# Patient Record
Sex: Female | Born: 1960 | Race: Black or African American | Hispanic: No
Health system: Southern US, Community
[De-identification: ages and names within clinical notes are randomized; demographics above are authoritative.]

---

## 2006-01-28 ENCOUNTER — Ambulatory Visit: Payer: Self-pay | Admitting: Internal Medicine

## 2006-02-05 ENCOUNTER — Ambulatory Visit: Payer: Self-pay | Admitting: Internal Medicine

## 2007-06-09 IMAGING — CT CT CHEST W/ CM
1 of 2 series · 14 of 32 positions shown, 18 images · IV contrast (agent unspecified)
Comparison: none

REASON FOR EXAM: Shortness of breath
COMMENTS:

PROCEDURE:     CT  - CT CHEST WITH CONTRAST  - February 05, 2006 [DATE]
RESULT:     CT scan of chest is performed with contrast.
Comparison is made to an examination dated 01/01/2006 performed at [REDACTED] and interpreted by [REDACTED].

[Series 2: soft tissue · axial · 0.59mm/px · z∈[-313,-53]mm · 14 of 58 slices shown, 18 images]
[im 4/58  soft-tissue]
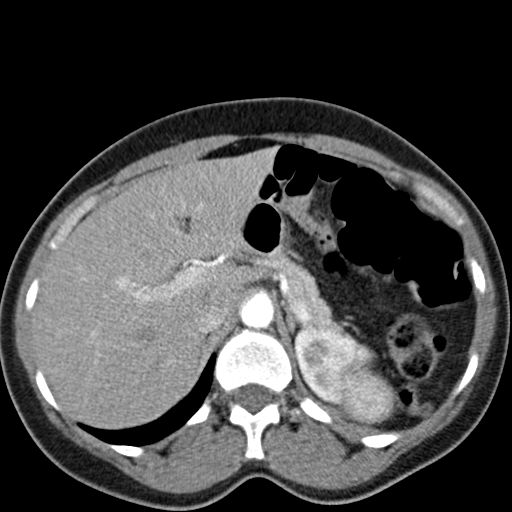
[im 4/58  bone]
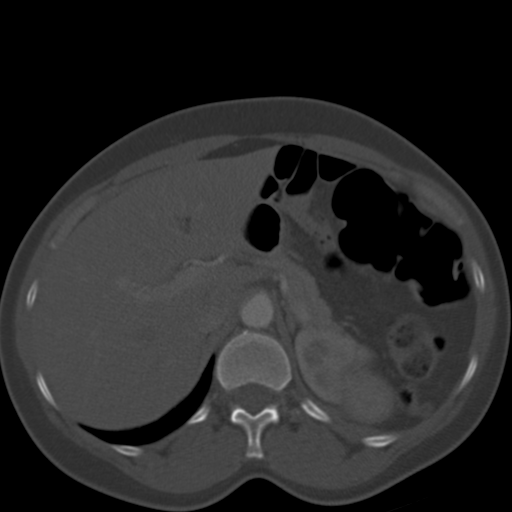
[im 8/58  soft-tissue]
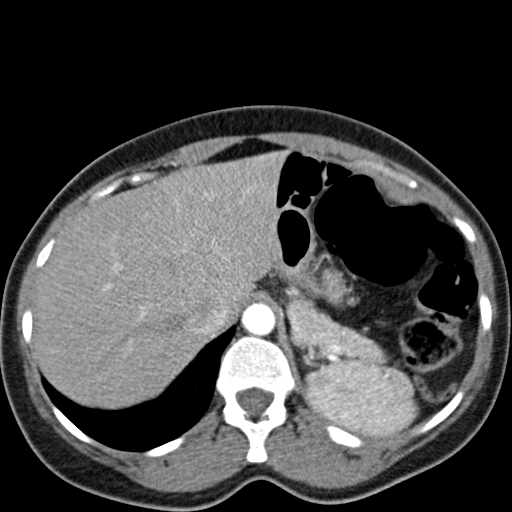
[im 12/58  soft-tissue]
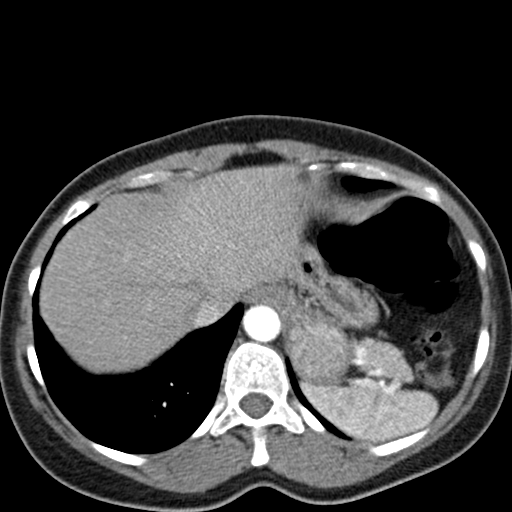
[im 18/58  soft-tissue]
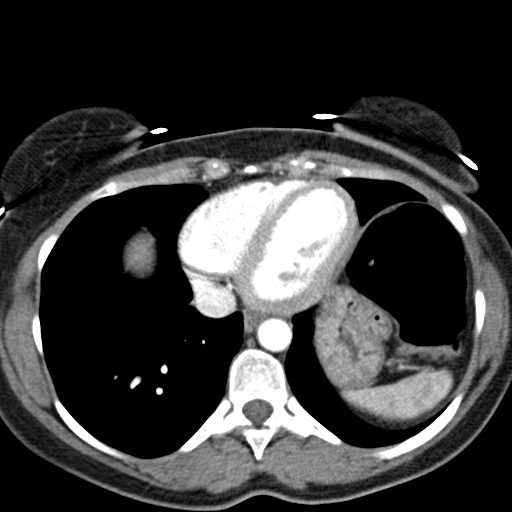
[im 22/58  soft-tissue]
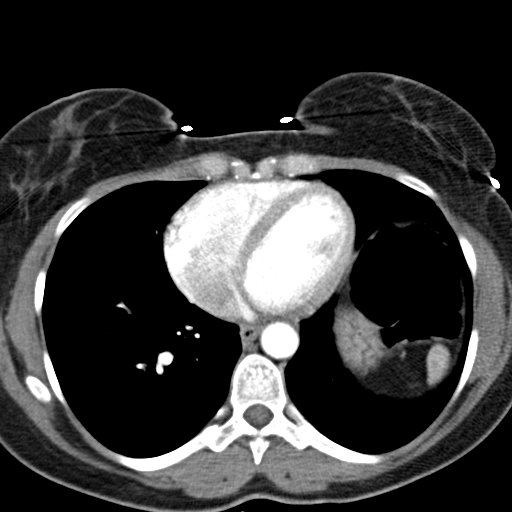
[im 26/58  soft-tissue]
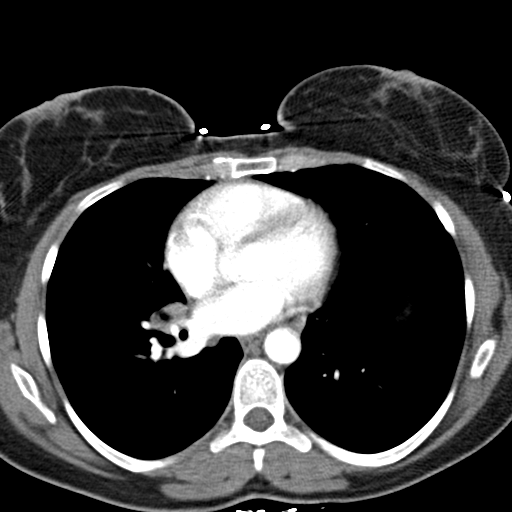
[im 32/58  soft-tissue]
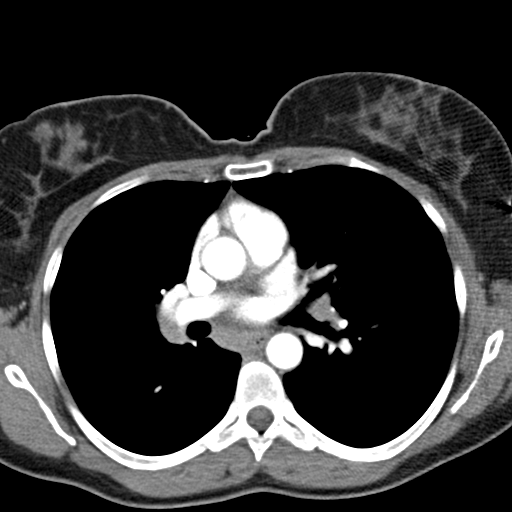
[im 36/58  soft-tissue]
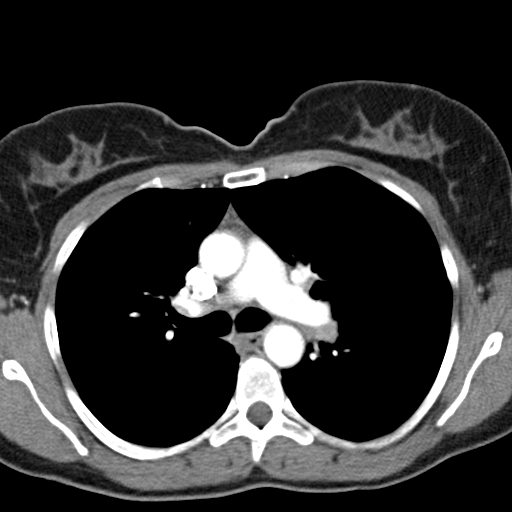
[im 40/58  soft-tissue]
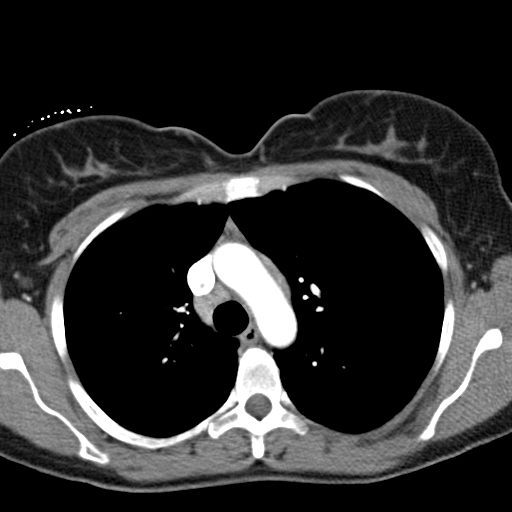
[im 40/58  bone]
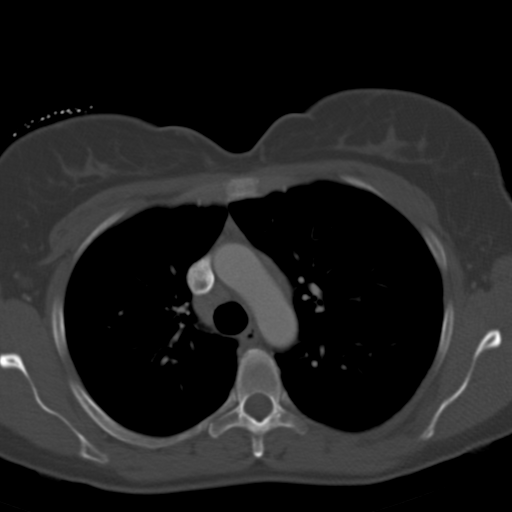
[im 46/58  soft-tissue]
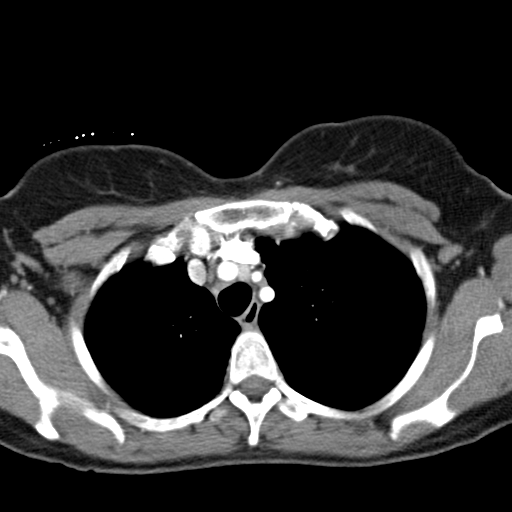
[im 50/58  soft-tissue]
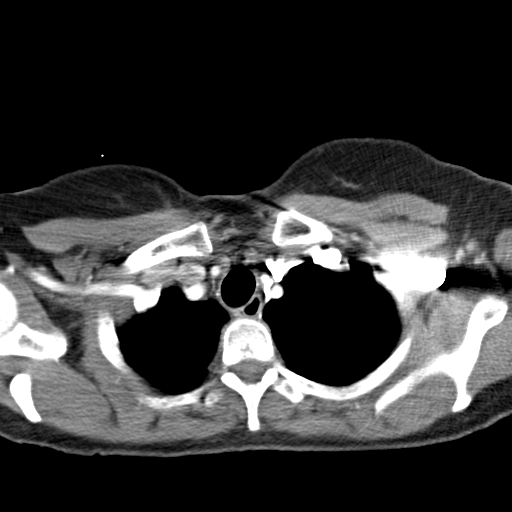
[im 50/58  lung]
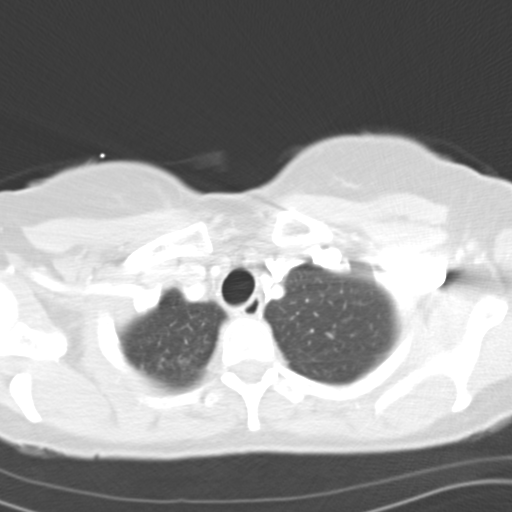
[im 52/58  lung]
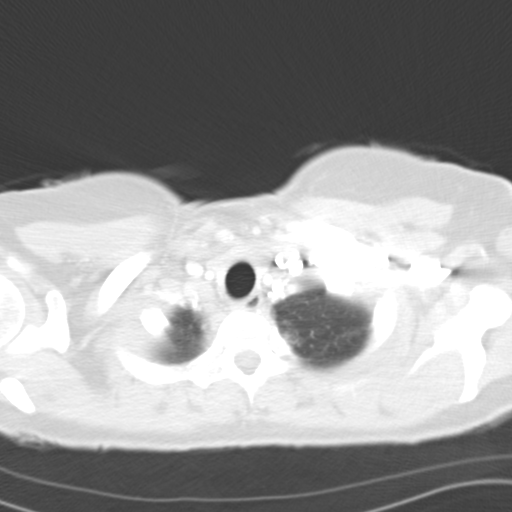
[im 54/58  soft-tissue]
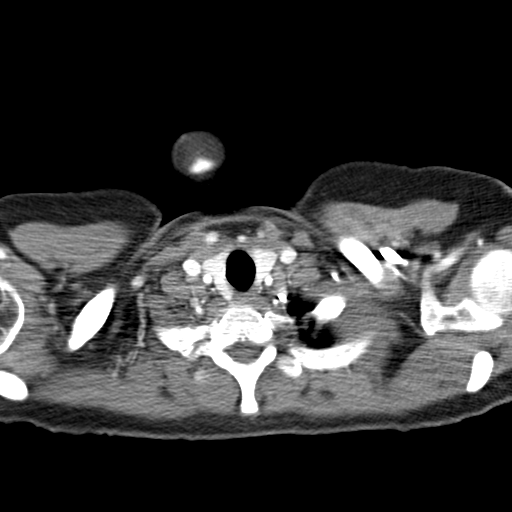
[im 54/58  lung]
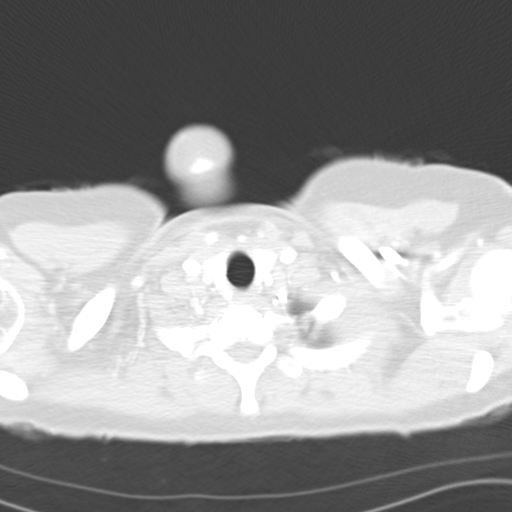
[im 56/58  lung]
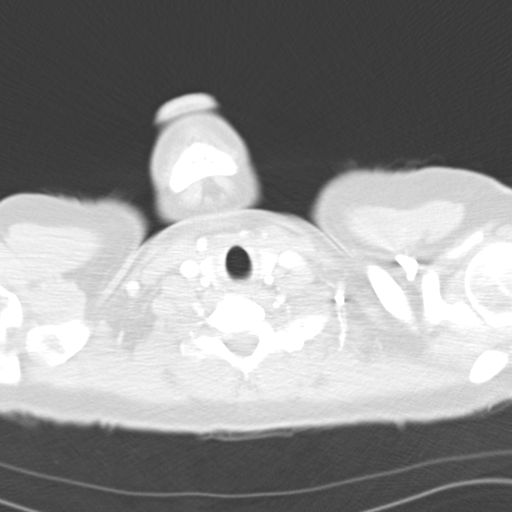

[14 of 32 positions shown; findings below may reference images not displayed]

FINDINGS: There is again minimal nodularity on the lung window images on
image #10 of today's exam which appears to be unchanged. This is not
demonstrated on the soft tissue window settings. No definite associated
calcification is seen. On image #11, there is a tiny density lateral to this
posteriorly near the area of the fissure. The subtle nodular density seen in
the mid portion of the LEFT lung, at the level of the aortic arch on the
previous study (image #17), is seen on the [HOSPITAL] study today on image #18.
These areas of nodularity appear to be stable. No definite cluster of
nodules is seen. The minimal density seen near the minor fissure on the
prior study on image #24 is not evident on today's examination. The lungs
otherwise remain clear. There is mild elevation of the LEFT hemidiaphragm
with a moderate amount of gas seen in the colon as was present on the prior
study. High resolution images show no definite bronchiectasis. No subpleural
sclerosis or linear density is seen elsewhere. There may be minimal apical
fibrosis on the LEFT posteriorly on image #4 of the high resolution images.

Supraclavicular lymph nodes are again noted but not enlarged by size
criteria. Shotty lymph nodes are also noted again in the axillary regions.
There is hilar adenopathy with subcarinal and pretracheal adenopathy as
noted on the prior study. The pretracheal adenopathy measures approximately
13.0 mm on the current study which shows evidence of some increase in size.
The RIGHT hilar lymph node on image #25 measures up to 2.0 cm anterior to
posterior. This appears to be slightly more prominent than on the prior
study.
IMPRESSION: 1.     Slight more pronounced mediastinal adenopathy with persistent
suprahilar and axillary prominent lymph nodes. Subcarinal adenopathy and
pretracheal adenopathy persists.
2.     The nodular densities seen in the lungs as described appear to be
essentially stable although a nodule near the minor fissure on the RIGHT is
not evident. Again, the possibility of sarcoidosis should be considered.

## 2019-12-08 ENCOUNTER — Ambulatory Visit: Payer: Self-pay | Attending: Internal Medicine

## 2019-12-08 DIAGNOSIS — Z23 Encounter for immunization: Secondary | ICD-10-CM

## 2019-12-08 NOTE — Progress Notes (Signed)
   Covid-[facility] Vaccination [facility]  Name:  Hazley Dezeeuw    MRN: [id number] DOB: 1960/12/26  12/08/2019  Ms. Zwicker was observed post Covid-19 immunization for 15 minutes without incident. She was provided with Vaccine Information Sheet and instruction to access the V-Safe system.   Ms. Sherburne was instructed to call 911 with any severe reactions post vaccine: Marland Kitchen Difficulty breathing  . Swelling of face and throat  . A fast heartbeat  . A bad rash all over body  . Dizziness and weakness   Immunizations Administered    Name Date Dose VIS Date Route   Pfizer COVID-19 Vaccine 12/08/2019  5:57 PM 0.3 mL 09/01/2019 Intramuscular   Manufacturer: [employer], [employer]   Lot: [id number]   NDC: [address]-7

## 2019-12-29 ENCOUNTER — Ambulatory Visit: Payer: Self-pay | Attending: Internal Medicine

## 2019-12-29 DIAGNOSIS — Z23 Encounter for immunization: Secondary | ICD-10-CM

## 2019-12-29 NOTE — Progress Notes (Signed)
   Covid-19 Vaccination Clinic  Name:  Khiya Friese    MRN: [id number] DOB: April 14, 1961  12/29/2019  Ms. Klar was observed post Covid-19 immunization for 15 minutes without incident. She was provided with Vaccine Information Sheet and instruction to access the V-Safe system.   Ms. Rawl was instructed to call 911 with any severe reactions post vaccine: Marland Kitchen Difficulty breathing  . Swelling of face and throat  . A fast heartbeat  . A bad rash all over body  . Dizziness and weakness   Immunizations Administered    Name Date Dose VIS Date Route   Pfizer COVID-19 Vaccine 12/29/2019  5:03 PM 0.3 mL 09/01/2019 Intramuscular   Manufacturer: [employer], [employer]   Lot: [id number]   NDC: [address]-8
